# Patient Record
Sex: Male | Born: 1935 | Race: White | Hispanic: No | Smoking: Former smoker
Health system: Southern US, Community
[De-identification: ages and names within clinical notes are randomized; demographics above are authoritative.]

## PROBLEM LIST (undated history)

## (undated) DIAGNOSIS — R319 Hematuria, unspecified: Secondary | ICD-10-CM

## (undated) DIAGNOSIS — R972 Elevated prostate specific antigen [PSA]: Secondary | ICD-10-CM

## (undated) DIAGNOSIS — N400 Enlarged prostate without lower urinary tract symptoms: Secondary | ICD-10-CM

## (undated) DIAGNOSIS — N210 Calculus in bladder: Secondary | ICD-10-CM

## (undated) DIAGNOSIS — N3941 Urge incontinence: Secondary | ICD-10-CM

## (undated) DIAGNOSIS — R339 Retention of urine, unspecified: Secondary | ICD-10-CM

## (undated) DIAGNOSIS — R351 Nocturia: Secondary | ICD-10-CM

## (undated) DIAGNOSIS — N390 Urinary tract infection, site not specified: Secondary | ICD-10-CM

## (undated) DIAGNOSIS — G459 Transient cerebral ischemic attack, unspecified: Secondary | ICD-10-CM

## (undated) HISTORY — DX: Transient cerebral ischemic attack, unspecified: G45.9

## (undated) HISTORY — DX: Urge incontinence: N39.41

## (undated) HISTORY — PX: HERNIA REPAIR: SHX51

## (undated) HISTORY — PX: VASECTOMY: SHX75

## (undated) HISTORY — DX: Elevated prostate specific antigen (PSA): R97.20

## (undated) HISTORY — PX: TONSILLECTOMY: SUR1361

## (undated) HISTORY — DX: Calculus in bladder: N21.0

## (undated) HISTORY — DX: Urinary tract infection, site not specified: N39.0

## (undated) HISTORY — DX: Nocturia: R35.1

## (undated) HISTORY — DX: Hematuria, unspecified: R31.9

## (undated) HISTORY — DX: Benign prostatic hyperplasia without lower urinary tract symptoms: N40.0

## (undated) HISTORY — DX: Retention of urine, unspecified: R33.9

---

## 1999-02-23 ENCOUNTER — Inpatient Hospital Stay (HOSPITAL_COMMUNITY): Admission: EM | Admit: 1999-02-23 | Discharge: 1999-02-26 | Payer: Self-pay | Admitting: Emergency Medicine

## 2009-06-19 ENCOUNTER — Ambulatory Visit: Payer: Self-pay

## 2009-10-23 ENCOUNTER — Inpatient Hospital Stay: Payer: Self-pay | Admitting: Internal Medicine

## 2009-11-17 ENCOUNTER — Ambulatory Visit: Payer: Self-pay | Admitting: Gastroenterology

## 2010-02-24 ENCOUNTER — Ambulatory Visit: Payer: Self-pay | Admitting: Gastroenterology

## 2010-04-26 ENCOUNTER — Emergency Department: Payer: Self-pay | Admitting: Emergency Medicine

## 2010-07-14 ENCOUNTER — Emergency Department: Payer: Self-pay | Admitting: Emergency Medicine

## 2010-11-12 ENCOUNTER — Ambulatory Visit: Payer: Self-pay | Admitting: Neurology

## 2010-12-14 ENCOUNTER — Ambulatory Visit: Payer: Self-pay | Admitting: Gastroenterology

## 2011-09-06 ENCOUNTER — Ambulatory Visit: Payer: Self-pay | Admitting: Family Medicine

## 2011-09-12 ENCOUNTER — Ambulatory Visit: Payer: Self-pay | Admitting: Family Medicine

## 2011-10-13 ENCOUNTER — Ambulatory Visit: Payer: Self-pay | Admitting: Family Medicine

## 2012-01-12 ENCOUNTER — Ambulatory Visit: Payer: Self-pay | Admitting: Neurology

## 2012-01-19 ENCOUNTER — Ambulatory Visit: Payer: Self-pay | Admitting: Neurology

## 2012-02-04 ENCOUNTER — Emergency Department: Payer: Self-pay | Admitting: Internal Medicine

## 2012-07-12 ENCOUNTER — Emergency Department: Payer: Self-pay | Admitting: Emergency Medicine

## 2012-07-12 LAB — CBC
HCT: 38.3 % — ABNORMAL LOW (ref 40.0–52.0)
HGB: 13.4 g/dL (ref 13.0–18.0)
MCH: 32.8 pg (ref 26.0–34.0)
MCHC: 35 g/dL (ref 32.0–36.0)
MCV: 94 fL (ref 80–100)
Platelet: 179 10*3/uL (ref 150–440)
RBC: 4.08 10*6/uL — ABNORMAL LOW (ref 4.40–5.90)
RDW: 12.8 % (ref 11.5–14.5)
WBC: 6.2 10*3/uL (ref 3.8–10.6)

## 2012-07-12 LAB — COMPREHENSIVE METABOLIC PANEL
Albumin: 4 g/dL (ref 3.4–5.0)
Alkaline Phosphatase: 58 U/L (ref 50–136)
Anion Gap: 7 (ref 7–16)
BUN: 18 mg/dL (ref 7–18)
Bilirubin,Total: 1.7 mg/dL — ABNORMAL HIGH (ref 0.2–1.0)
Calcium, Total: 8.9 mg/dL (ref 8.5–10.1)
Chloride: 107 mmol/L (ref 98–107)
Co2: 29 mmol/L (ref 21–32)
Creatinine: 0.84 mg/dL (ref 0.60–1.30)
EGFR (African American): >60
EGFR (Non-African Amer.): >60
Glucose: 99 mg/dL (ref 65–99)
Osmolality: 287 (ref 275–301)
Potassium: 3.6 mmol/L (ref 3.5–5.1)
SGOT(AST): 32 U/L (ref 15–37)
SGPT (ALT): 27 U/L
Sodium: 143 mmol/L (ref 136–145)
Total Protein: 7.1 g/dL (ref 6.4–8.2)

## 2012-07-12 LAB — LIPID PANEL
Cholesterol: 133 mg/dL (ref 0–200)
HDL Cholesterol: 54 mg/dL (ref 40–60)
Ldl Cholesterol, Calc: 60 mg/dL (ref 0–100)
Triglycerides: 94 mg/dL (ref 0–200)
VLDL Cholesterol, Calc: 19 mg/dL (ref 5–40)

## 2012-07-12 LAB — URINALYSIS, COMPLETE
Bacteria: NONE SEEN
Bilirubin,UR: NEGATIVE
Blood: NEGATIVE
Glucose,UR: NEGATIVE mg/dL (ref 0–75)
Nitrite: NEGATIVE
Ph: 5 (ref 4.5–8.0)
Protein: NEGATIVE
RBC,UR: 4 /HPF (ref 0–5)
Specific Gravity: 1.019 (ref 1.003–1.030)
Squamous Epithelial: NONE SEEN
WBC UR: 2 /HPF (ref 0–5)

## 2012-07-12 LAB — PROTIME-INR
INR: 0.9
Prothrombin Time: 12.8 secs (ref 11.5–14.7)

## 2012-07-12 LAB — HEMOGLOBIN A1C: Hemoglobin A1C: 6 % (ref 4.2–6.3)

## 2012-08-07 ENCOUNTER — Encounter: Payer: Self-pay | Admitting: Neurology

## 2012-08-12 ENCOUNTER — Encounter: Payer: Self-pay | Admitting: Neurology

## 2012-09-11 ENCOUNTER — Encounter: Payer: Self-pay | Admitting: Neurology

## 2012-12-21 ENCOUNTER — Emergency Department: Payer: Self-pay | Admitting: Emergency Medicine

## 2013-06-29 IMAGING — US US CAROTID DUPLEX BILAT
1 series · 17 of 24 positions shown · non-contrast
Comparison: none

REASON FOR EXAM: TIA
COMMENTS:

[Series 1: us carotid duplex bilat · 17 of 54 slices shown]
[im 1/54]
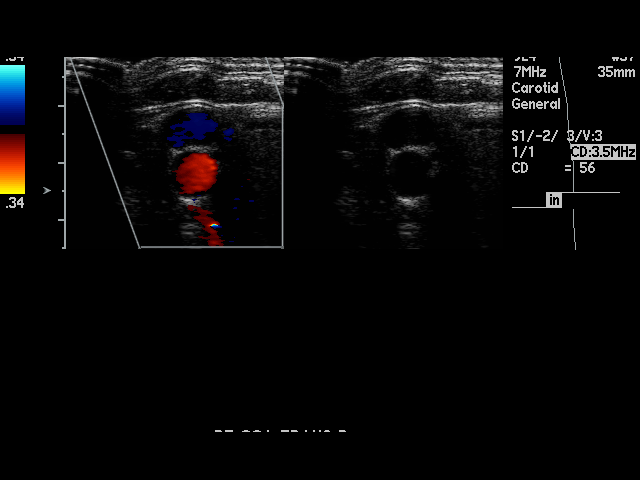
[im 5/54]
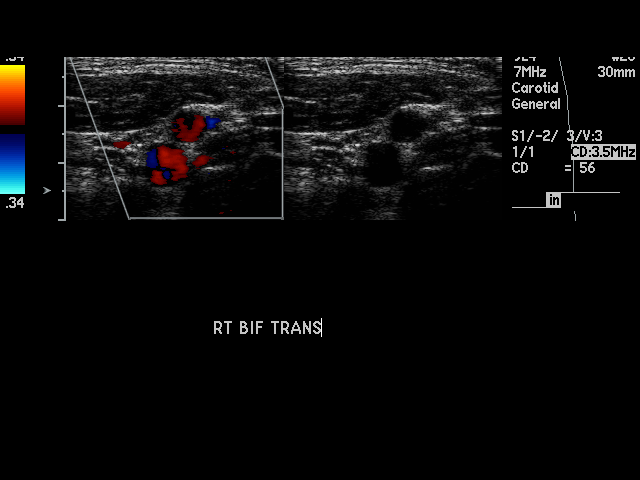
[im 7/54]
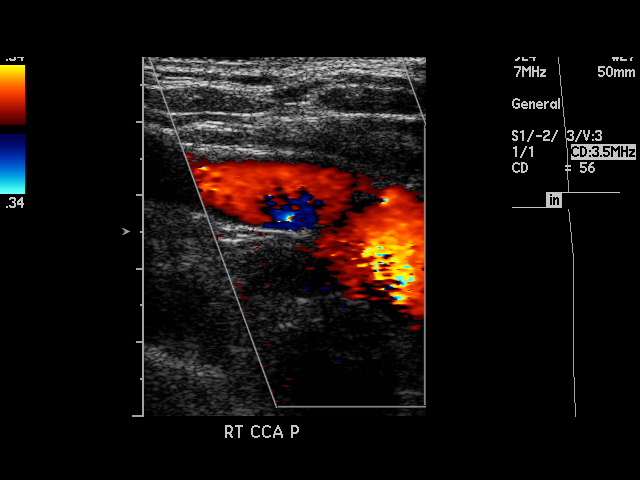
[im 10/54]
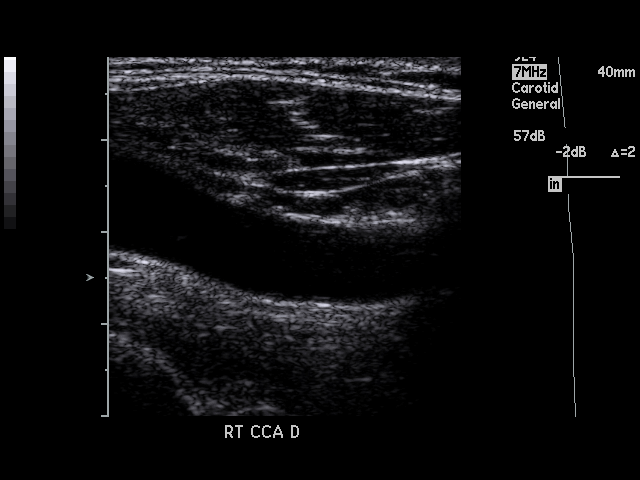
[im 14/54]
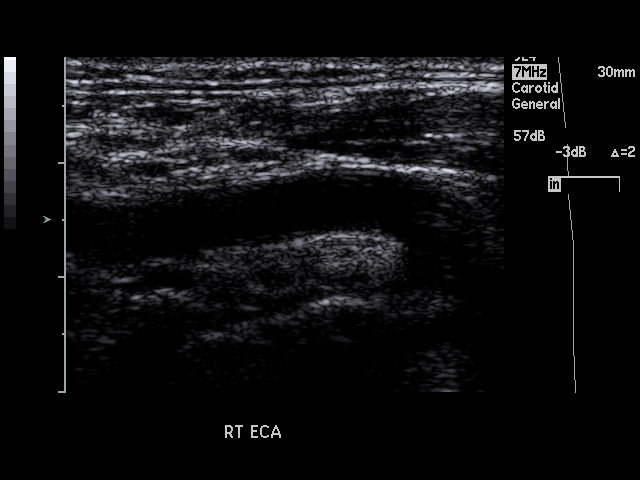
[im 17/54]
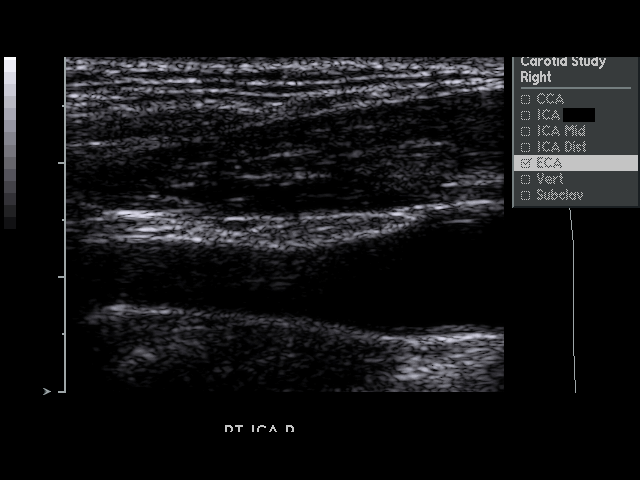
[im 21/54]
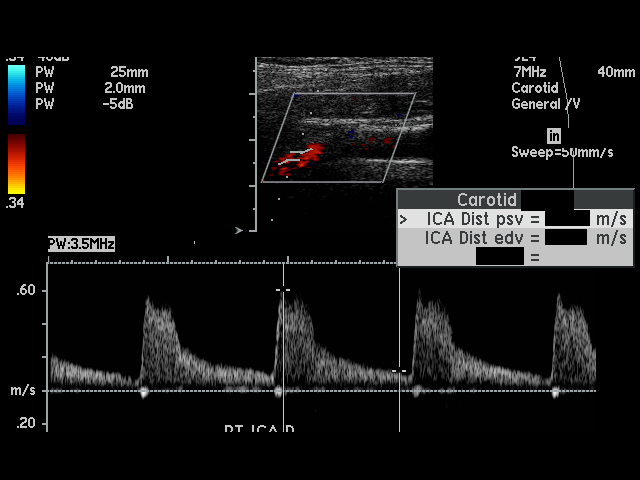
[im 24/54]
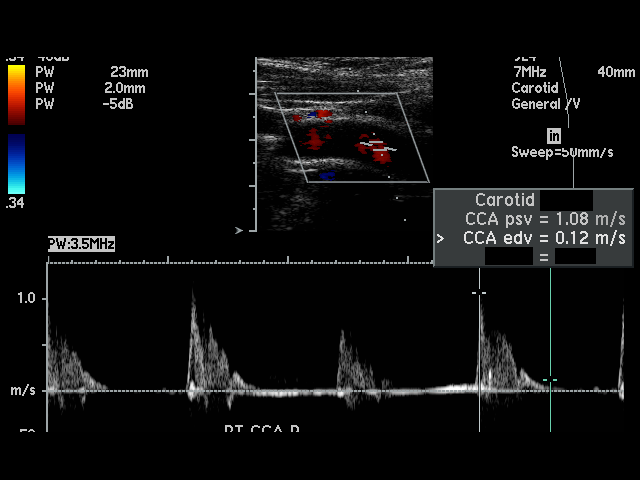
[im 28/54]
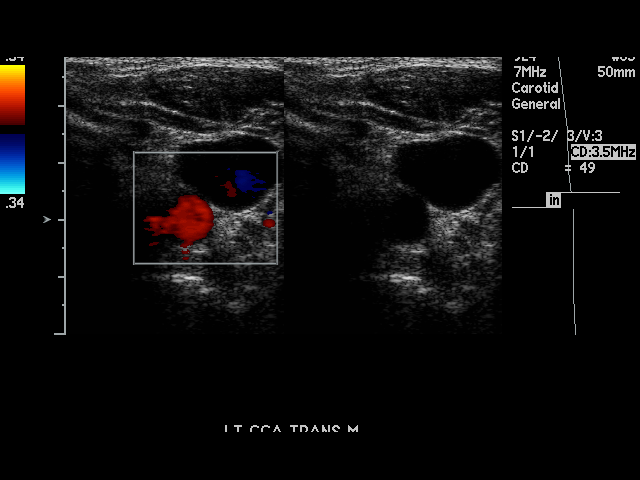
[im 30/54]
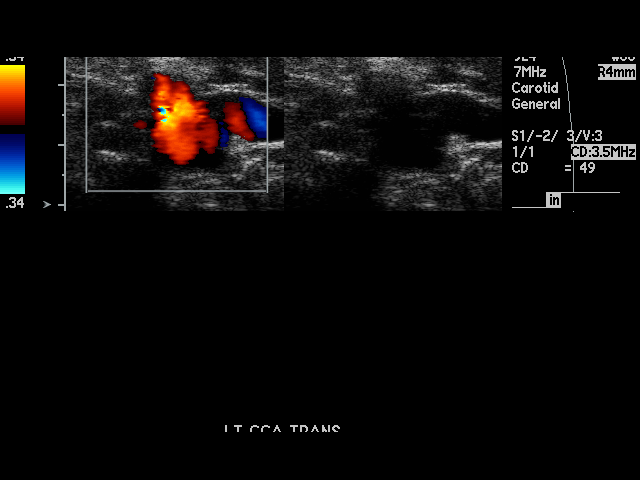
[im 33/54]
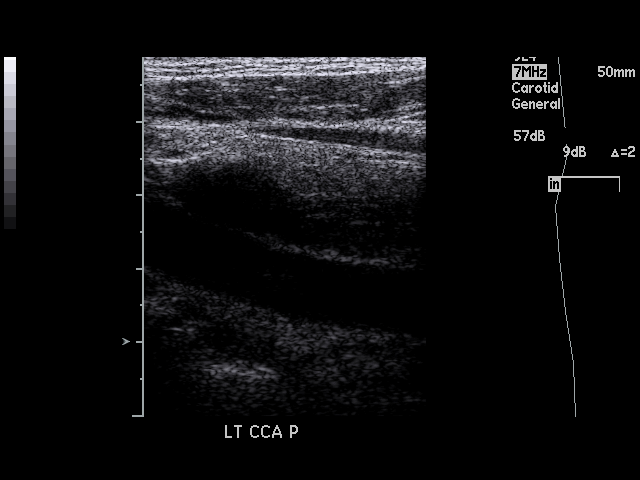
[im 37/54]
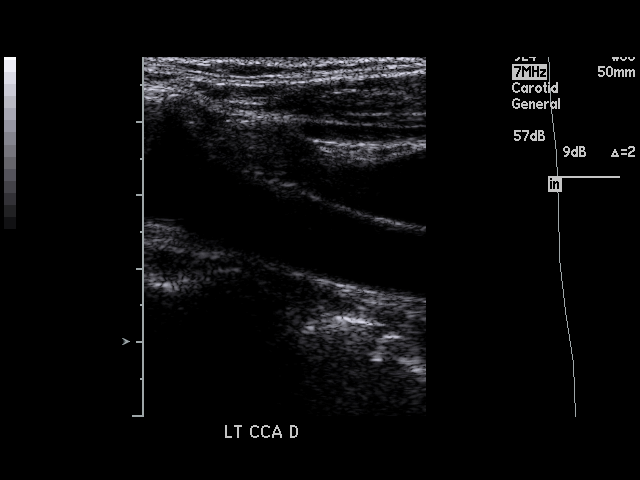
[im 40/54]
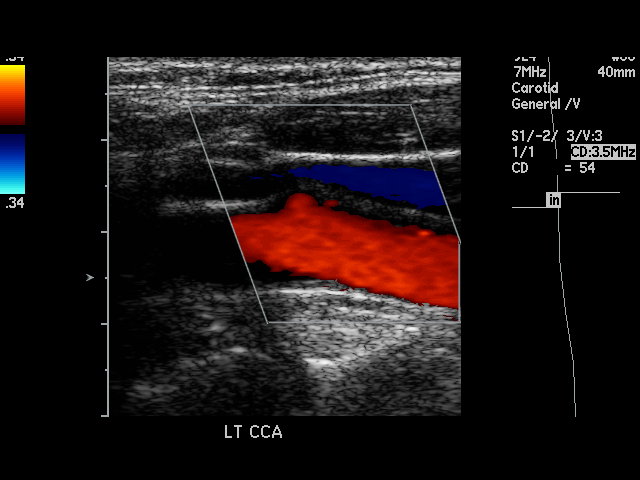
[im 44/54]
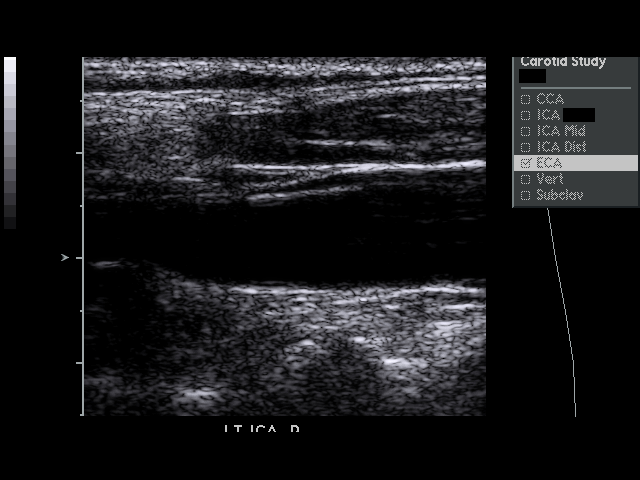
[im 47/54]
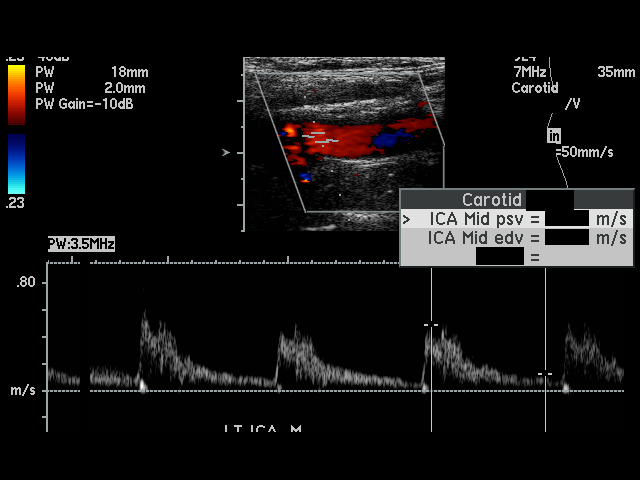
[im 49/54]
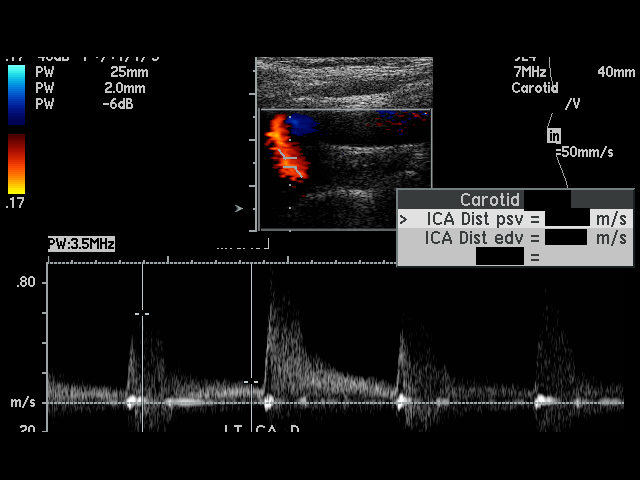
[im 54/54]
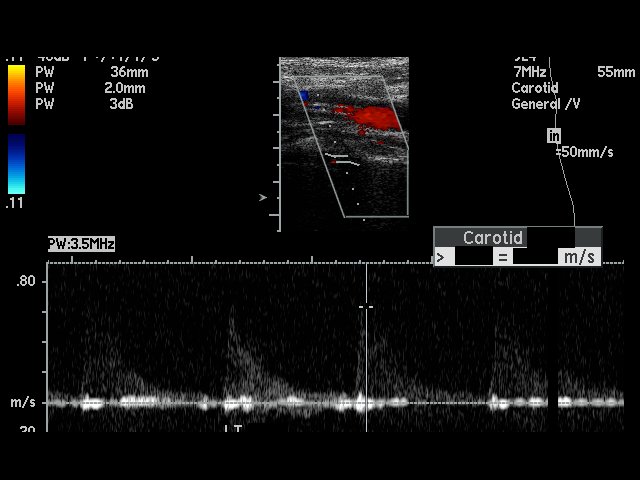

[17 of 24 positions shown; findings below may reference images not displayed]

PROCEDURE:     TELLIA - TELLIA CAROTID DOPPLER BILATERAL  - January 12, 2012  [DATE]

RESULT:     Carotid Doppler interrogation is performed in the standard
fashion. There is no previous study for comparison.

No atherosclerotic plaque formation is visualized in the carotids. Antegrade
flow is present in the carotids and vertebrals bilaterally. The peak
systolic velocities are normal bilaterally. The internal to common carotid
peak systolic velocity ratio is 0.86 on the right and 0.87 on the left.
IMPRESSION: 1. No evidence of hemodynamically significant stenosis. No atherosclerotic
disease demonstrated.

## 2013-09-11 ENCOUNTER — Emergency Department: Payer: Self-pay | Admitting: Emergency Medicine

## 2013-09-11 LAB — COMPREHENSIVE METABOLIC PANEL
Albumin: 3.7 g/dL (ref 3.4–5.0)
Alkaline Phosphatase: 60 U/L (ref 50–136)
Anion Gap: 7 (ref 7–16)
BUN: 17 mg/dL (ref 7–18)
Bilirubin,Total: 1.1 mg/dL — ABNORMAL HIGH (ref 0.2–1.0)
Calcium, Total: 9.2 mg/dL (ref 8.5–10.1)
Chloride: 110 mmol/L — ABNORMAL HIGH (ref 98–107)
Co2: 25 mmol/L (ref 21–32)
Creatinine: 1.11 mg/dL (ref 0.60–1.30)
EGFR (African American): >60
EGFR (Non-African Amer.): >60
Glucose: 136 mg/dL — ABNORMAL HIGH (ref 65–99)
Osmolality: 287 (ref 275–301)
Potassium: 3.8 mmol/L (ref 3.5–5.1)
SGOT(AST): 24 U/L (ref 15–37)
SGPT (ALT): 23 U/L (ref 12–78)
Sodium: 142 mmol/L (ref 136–145)
Total Protein: 6.9 g/dL (ref 6.4–8.2)

## 2013-09-11 LAB — URINALYSIS, COMPLETE
Bacteria: NONE SEEN
Bilirubin,UR: NEGATIVE
Blood: NEGATIVE
Glucose,UR: NEGATIVE mg/dL (ref 0–75)
Ketone: NEGATIVE
Leukocyte Esterase: NEGATIVE
Nitrite: NEGATIVE
Ph: 5 (ref 4.5–8.0)
Protein: NEGATIVE
RBC,UR: 1 /HPF (ref 0–5)
Specific Gravity: 1.021 (ref 1.003–1.030)
Squamous Epithelial: NONE SEEN
WBC UR: 2 /HPF (ref 0–5)

## 2013-09-11 LAB — CBC
HCT: 40 % (ref 40.0–52.0)
HGB: 13.7 g/dL (ref 13.0–18.0)
MCH: 31.5 pg (ref 26.0–34.0)
MCHC: 34.3 g/dL (ref 32.0–36.0)
MCV: 92 fL (ref 80–100)
Platelet: 205 10*3/uL (ref 150–440)
RBC: 4.35 10*6/uL — ABNORMAL LOW (ref 4.40–5.90)
RDW: 12.6 % (ref 11.5–14.5)
WBC: 6.2 10*3/uL (ref 3.8–10.6)

## 2013-09-11 LAB — TROPONIN I: Troponin-I: <0.02 ng/mL

## 2013-09-27 ENCOUNTER — Encounter: Payer: Self-pay | Admitting: Neurology

## 2013-10-12 ENCOUNTER — Encounter: Payer: Self-pay | Admitting: Neurology

## 2013-11-11 ENCOUNTER — Encounter: Payer: Self-pay | Admitting: Neurology

## 2014-02-13 ENCOUNTER — Ambulatory Visit: Payer: Self-pay | Admitting: Urology

## 2014-03-26 ENCOUNTER — Ambulatory Visit: Payer: Self-pay | Admitting: Urology

## 2014-03-26 LAB — BASIC METABOLIC PANEL
Anion Gap: 7 (ref 7–16)
BUN: 20 mg/dL — ABNORMAL HIGH (ref 7–18)
CREATININE: 0.76 mg/dL (ref 0.60–1.30)
Calcium, Total: 8.9 mg/dL (ref 8.5–10.1)
Chloride: 109 mmol/L — ABNORMAL HIGH (ref 98–107)
Co2: 27 mmol/L (ref 21–32)
EGFR (African American): >60
Glucose: 137 mg/dL — ABNORMAL HIGH (ref 65–99)
Osmolality: 290 (ref 275–301)
Potassium: 3.4 mmol/L — ABNORMAL LOW (ref 3.5–5.1)
SODIUM: 143 mmol/L (ref 136–145)

## 2014-03-26 LAB — CBC
HCT: 40.3 % (ref 40.0–52.0)
HGB: 13 g/dL (ref 13.0–18.0)
MCH: 30.5 pg (ref 26.0–34.0)
MCHC: 32.3 g/dL (ref 32.0–36.0)
MCV: 95 fL (ref 80–100)
Platelet: 167 10*3/uL (ref 150–440)
RBC: 4.26 10*6/uL — ABNORMAL LOW (ref 4.40–5.90)
RDW: 13.1 % (ref 11.5–14.5)
WBC: 6.1 10*3/uL (ref 3.8–10.6)

## 2014-04-07 ENCOUNTER — Ambulatory Visit: Payer: Self-pay | Admitting: Urology

## 2014-05-27 ENCOUNTER — Ambulatory Visit: Payer: Self-pay | Admitting: Gastroenterology

## 2014-09-03 ENCOUNTER — Emergency Department: Payer: Self-pay | Admitting: Emergency Medicine

## 2014-10-01 ENCOUNTER — Inpatient Hospital Stay: Payer: Self-pay | Admitting: Internal Medicine

## 2014-10-01 LAB — COMPREHENSIVE METABOLIC PANEL
ALBUMIN: 3.7 g/dL (ref 3.4–5.0)
ANION GAP: 6 — AB (ref 7–16)
AST: 32 U/L (ref 15–37)
Alkaline Phosphatase: 62 U/L
BUN: 20 mg/dL — AB (ref 7–18)
Bilirubin,Total: 0.8 mg/dL (ref 0.2–1.0)
CALCIUM: 8.5 mg/dL (ref 8.5–10.1)
CHLORIDE: 110 mmol/L — AB (ref 98–107)
Co2: 26 mmol/L (ref 21–32)
Creatinine: 1.01 mg/dL (ref 0.60–1.30)
EGFR (Non-African Amer.): >60
Glucose: 116 mg/dL — ABNORMAL HIGH (ref 65–99)
OSMOLALITY: 287 (ref 275–301)
Potassium: 4.2 mmol/L (ref 3.5–5.1)
SGPT (ALT): 26 U/L
Sodium: 142 mmol/L (ref 136–145)
TOTAL PROTEIN: 6.8 g/dL (ref 6.4–8.2)

## 2014-10-01 LAB — CBC
HCT: 37.4 % — ABNORMAL LOW (ref 40.0–52.0)
HGB: 12.2 g/dL — ABNORMAL LOW (ref 13.0–18.0)
MCH: 31 pg (ref 26.0–34.0)
MCHC: 32.5 g/dL (ref 32.0–36.0)
MCV: 95 fL (ref 80–100)
Platelet: 180 10*3/uL (ref 150–440)
RBC: 3.93 10*6/uL — AB (ref 4.40–5.90)
RDW: 12.8 % (ref 11.5–14.5)
WBC: 8.8 10*3/uL (ref 3.8–10.6)

## 2014-10-01 LAB — APTT: Activated PTT: 26.9 secs (ref 23.6–35.9)

## 2014-10-01 LAB — URINALYSIS, COMPLETE
BLOOD: NEGATIVE
Bacteria: NONE SEEN
Bilirubin,UR: NEGATIVE
GLUCOSE, UR: NEGATIVE mg/dL (ref 0–75)
Ketone: NEGATIVE
Nitrite: NEGATIVE
PROTEIN: NEGATIVE
Ph: 5 (ref 4.5–8.0)
RBC,UR: 2 /HPF (ref 0–5)
Specific Gravity: 1.021 (ref 1.003–1.030)
Squamous Epithelial: NONE SEEN
WBC UR: 4 /HPF (ref 0–5)

## 2014-10-01 LAB — PROTIME-INR
INR: 1
Prothrombin Time: 13.5 secs (ref 11.5–14.7)

## 2014-10-01 LAB — CK TOTAL AND CKMB (NOT AT ARMC)
CK, Total: 111 U/L
CK-MB: 3.4 ng/mL (ref 0.5–3.6)

## 2014-10-01 LAB — TROPONIN I: Troponin-I: <0.02 ng/mL

## 2014-10-02 LAB — CBC WITH DIFFERENTIAL/PLATELET
Basophil #: 0.1 10*3/uL (ref 0.0–0.1)
Basophil %: 0.7 %
Eosinophil #: 0.2 10*3/uL (ref 0.0–0.7)
Eosinophil %: 1.4 %
HCT: 34.5 % — AB (ref 40.0–52.0)
HGB: 11.1 g/dL — AB (ref 13.0–18.0)
LYMPHS ABS: 0.6 10*3/uL — AB (ref 1.0–3.6)
Lymphocyte %: 5.7 %
MCH: 30.5 pg (ref 26.0–34.0)
MCHC: 32 g/dL (ref 32.0–36.0)
MCV: 95 fL (ref 80–100)
Monocyte #: 1.1 x10 3/mm — ABNORMAL HIGH (ref 0.2–1.0)
Monocyte %: 9.4 %
NEUTROS ABS: 9.4 10*3/uL — AB (ref 1.4–6.5)
NEUTROS PCT: 82.8 %
Platelet: 163 10*3/uL (ref 150–440)
RBC: 3.63 10*6/uL — ABNORMAL LOW (ref 4.40–5.90)
RDW: 12.8 % (ref 11.5–14.5)
WBC: 11.4 10*3/uL — AB (ref 3.8–10.6)

## 2014-10-02 LAB — BASIC METABOLIC PANEL
Anion Gap: 6 — ABNORMAL LOW (ref 7–16)
BUN: 20 mg/dL — AB (ref 7–18)
CALCIUM: 8.3 mg/dL — AB (ref 8.5–10.1)
CREATININE: 0.99 mg/dL (ref 0.60–1.30)
Chloride: 107 mmol/L (ref 98–107)
Co2: 27 mmol/L (ref 21–32)
EGFR (African American): >60
EGFR (Non-African Amer.): >60
GLUCOSE: 115 mg/dL — AB (ref 65–99)
OSMOLALITY: 283 (ref 275–301)
POTASSIUM: 4.1 mmol/L (ref 3.5–5.1)
SODIUM: 140 mmol/L (ref 136–145)

## 2014-10-03 LAB — URINE CULTURE

## 2014-10-04 LAB — BASIC METABOLIC PANEL
Anion Gap: 7 (ref 7–16)
BUN: 16 mg/dL (ref 7–18)
CHLORIDE: 106 mmol/L (ref 98–107)
CO2: 26 mmol/L (ref 21–32)
Calcium, Total: 8.1 mg/dL — ABNORMAL LOW (ref 8.5–10.1)
Creatinine: 0.81 mg/dL (ref 0.60–1.30)
EGFR (African American): >60
EGFR (Non-African Amer.): >60
Glucose: 147 mg/dL — ABNORMAL HIGH (ref 65–99)
Osmolality: 281 (ref 275–301)
Potassium: 4.3 mmol/L (ref 3.5–5.1)
Sodium: 139 mmol/L (ref 136–145)

## 2014-10-04 LAB — HEMOGLOBIN: HGB: 10.3 g/dL — ABNORMAL LOW (ref 13.0–18.0)

## 2014-10-04 LAB — PLATELET COUNT: Platelet: 166 10*3/uL (ref 150–440)

## 2014-10-06 LAB — CLOSTRIDIUM DIFFICILE(ARMC)

## 2014-10-08 DIAGNOSIS — S72001A Fracture of unspecified part of neck of right femur, initial encounter for closed fracture: Secondary | ICD-10-CM

## 2014-10-08 DIAGNOSIS — A047 Enterocolitis due to Clostridium difficile: Secondary | ICD-10-CM

## 2014-10-08 DIAGNOSIS — F0390 Unspecified dementia without behavioral disturbance: Secondary | ICD-10-CM

## 2014-10-10 DIAGNOSIS — F419 Anxiety disorder, unspecified: Secondary | ICD-10-CM

## 2014-10-13 DIAGNOSIS — A047 Enterocolitis due to Clostridium difficile: Secondary | ICD-10-CM

## 2014-10-13 DIAGNOSIS — S7292XA Unspecified fracture of left femur, initial encounter for closed fracture: Secondary | ICD-10-CM

## 2014-10-13 DIAGNOSIS — F0150 Vascular dementia without behavioral disturbance: Secondary | ICD-10-CM

## 2014-10-13 DIAGNOSIS — F39 Unspecified mood [affective] disorder: Secondary | ICD-10-CM

## 2014-10-13 DIAGNOSIS — N400 Enlarged prostate without lower urinary tract symptoms: Secondary | ICD-10-CM

## 2014-10-17 DIAGNOSIS — N508 Other specified disorders of male genital organs: Secondary | ICD-10-CM

## 2014-10-17 DIAGNOSIS — N492 Inflammatory disorders of scrotum: Secondary | ICD-10-CM

## 2014-10-17 DIAGNOSIS — B372 Candidiasis of skin and nail: Secondary | ICD-10-CM

## 2014-11-13 DIAGNOSIS — S72002A Fracture of unspecified part of neck of left femur, initial encounter for closed fracture: Secondary | ICD-10-CM

## 2014-11-13 DIAGNOSIS — F0150 Vascular dementia without behavioral disturbance: Secondary | ICD-10-CM

## 2014-11-24 DIAGNOSIS — Z634 Disappearance and death of family member: Secondary | ICD-10-CM

## 2014-12-25 DIAGNOSIS — F39 Unspecified mood [affective] disorder: Secondary | ICD-10-CM

## 2014-12-25 DIAGNOSIS — F0150 Vascular dementia without behavioral disturbance: Secondary | ICD-10-CM

## 2014-12-25 DIAGNOSIS — N401 Enlarged prostate with lower urinary tract symptoms: Secondary | ICD-10-CM

## 2015-01-06 ENCOUNTER — Inpatient Hospital Stay: Payer: Self-pay | Admitting: Internal Medicine

## 2015-01-06 LAB — BASIC METABOLIC PANEL
ANION GAP: 7 (ref 7–16)
BUN: 30 mg/dL — AB (ref 7–18)
CREATININE: 1.85 mg/dL — AB (ref 0.60–1.30)
Calcium, Total: 9 mg/dL (ref 8.5–10.1)
Chloride: 106 mmol/L (ref 98–107)
Co2: 26 mmol/L (ref 21–32)
EGFR (Non-African Amer.): 37 — ABNORMAL LOW
GFR CALC AF AMER: 45 — AB
GLUCOSE: 102 mg/dL — AB (ref 65–99)
Osmolality: 284 (ref 275–301)
Potassium: 4.3 mmol/L (ref 3.5–5.1)
Sodium: 139 mmol/L (ref 136–145)

## 2015-01-06 LAB — CBC WITH DIFFERENTIAL/PLATELET
Basophil #: 0 10*3/uL (ref 0.0–0.1)
Basophil %: 0.3 %
Eosinophil #: 0.1 10*3/uL (ref 0.0–0.7)
Eosinophil %: 0.6 %
HCT: 37.7 % — AB (ref 40.0–52.0)
HGB: 12 g/dL — ABNORMAL LOW (ref 13.0–18.0)
Lymphocyte #: 0.6 10*3/uL — ABNORMAL LOW (ref 1.0–3.6)
Lymphocyte %: 4.8 %
MCH: 27.5 pg (ref 26.0–34.0)
MCHC: 31.8 g/dL — AB (ref 32.0–36.0)
MCV: 87 fL (ref 80–100)
MONO ABS: 1.1 x10 3/mm — AB (ref 0.2–1.0)
Monocyte %: 9.1 %
NEUTROS ABS: 10.3 10*3/uL — AB (ref 1.4–6.5)
Neutrophil %: 85.2 %
RBC: 4.35 10*6/uL — ABNORMAL LOW (ref 4.40–5.90)
RDW: 16.4 % — ABNORMAL HIGH (ref 11.5–14.5)
WBC: 12.1 10*3/uL — ABNORMAL HIGH (ref 3.8–10.6)

## 2015-01-06 LAB — LIPASE, BLOOD: Lipase: 286 U/L (ref 73–393)

## 2015-01-06 LAB — URINALYSIS, COMPLETE
Bacteria: NONE SEEN
Bilirubin,UR: NEGATIVE
GLUCOSE, UR: NEGATIVE mg/dL (ref 0–75)
KETONE: NEGATIVE
Nitrite: NEGATIVE
PH: 5 (ref 4.5–8.0)
Protein: 30
RBC,UR: 222 /HPF (ref 0–5)
Specific Gravity: 1.016 (ref 1.003–1.030)
WBC UR: 64 /HPF (ref 0–5)

## 2015-01-06 LAB — CK: CK, TOTAL: 162 U/L (ref 39–308)

## 2015-01-06 LAB — TROPONIN I: Troponin-I: <0.02 ng/mL

## 2015-01-07 LAB — BASIC METABOLIC PANEL
ANION GAP: 8 (ref 7–16)
ANION GAP: 8 (ref 7–16)
BUN: 32 mg/dL — AB (ref 7–18)
BUN: 25 mg/dL — ABNORMAL HIGH (ref 7–18)
CO2: 26 mmol/L (ref 21–32)
CREATININE: 2.17 mg/dL — AB (ref 0.60–1.30)
Calcium, Total: 8.7 mg/dL (ref 8.5–10.1)
Calcium, Total: 8.3 mg/dL — ABNORMAL LOW (ref 8.5–10.1)
Chloride: 108 mmol/L — ABNORMAL HIGH (ref 98–107)
Chloride: 108 mmol/L — ABNORMAL HIGH (ref 98–107)
Co2: 25 mmol/L (ref 21–32)
Creatinine: 1.47 mg/dL — ABNORMAL HIGH (ref 0.60–1.30)
EGFR (African American): 38 — ABNORMAL LOW
EGFR (African American): 59 — ABNORMAL LOW
EGFR (Non-African Amer.): 31 — ABNORMAL LOW
EGFR (Non-African Amer.): 49 — ABNORMAL LOW
GLUCOSE: 91 mg/dL (ref 65–99)
Glucose: 117 mg/dL — ABNORMAL HIGH (ref 65–99)
Osmolality: 288 (ref 275–301)
Osmolality: 289 (ref 275–301)
Potassium: 3.6 mmol/L (ref 3.5–5.1)
Potassium: 3.6 mmol/L (ref 3.5–5.1)
SODIUM: 142 mmol/L (ref 136–145)
Sodium: 141 mmol/L (ref 136–145)

## 2015-01-07 LAB — CBC WITH DIFFERENTIAL/PLATELET
Basophil #: 0 10*3/uL (ref 0.0–0.1)
Basophil %: 0.4 %
Eosinophil #: 0.1 10*3/uL (ref 0.0–0.7)
Eosinophil %: 0.8 %
HCT: 32.4 % — ABNORMAL LOW (ref 40.0–52.0)
HGB: 10.4 g/dL — ABNORMAL LOW (ref 13.0–18.0)
Lymphocyte #: 0.5 10*3/uL — ABNORMAL LOW (ref 1.0–3.6)
Lymphocyte %: 4.5 %
MCH: 27.7 pg (ref 26.0–34.0)
MCHC: 32 g/dL (ref 32.0–36.0)
MCV: 87 fL (ref 80–100)
Monocyte #: 1.1 x10 3/mm — ABNORMAL HIGH (ref 0.2–1.0)
Monocyte %: 10.4 %
NEUTROS ABS: 9.1 10*3/uL — AB (ref 1.4–6.5)
Neutrophil %: 83.9 %
Platelet: 166 10*3/uL (ref 150–440)
RBC: 3.75 10*6/uL — ABNORMAL LOW (ref 4.40–5.90)
RDW: 16.4 % — AB (ref 11.5–14.5)
WBC: 10.8 10*3/uL — AB (ref 3.8–10.6)

## 2015-01-07 LAB — HEMOGLOBIN A1C: Hemoglobin A1C: 5.9 % (ref 4.2–6.3)

## 2015-01-08 LAB — BASIC METABOLIC PANEL
Anion Gap: 6 — ABNORMAL LOW (ref 7–16)
BUN: 16 mg/dL (ref 7–18)
Calcium, Total: 8.1 mg/dL — ABNORMAL LOW (ref 8.5–10.1)
Chloride: 112 mmol/L — ABNORMAL HIGH (ref 98–107)
Co2: 26 mmol/L (ref 21–32)
Creatinine: 0.85 mg/dL (ref 0.60–1.30)
EGFR (African American): >60
GLUCOSE: 110 mg/dL — AB (ref 65–99)
Osmolality: 289 (ref 275–301)
POTASSIUM: 3.4 mmol/L — AB (ref 3.5–5.1)
Sodium: 144 mmol/L (ref 136–145)

## 2015-01-08 LAB — CBC WITH DIFFERENTIAL/PLATELET
BASOS ABS: 0 10*3/uL (ref 0.0–0.1)
Basophil %: 0.7 %
Eosinophil #: 0.3 10*3/uL (ref 0.0–0.7)
Eosinophil %: 3.5 %
HCT: 30.3 % — ABNORMAL LOW (ref 40.0–52.0)
HGB: 9.7 g/dL — AB (ref 13.0–18.0)
LYMPHS ABS: 0.4 10*3/uL — AB (ref 1.0–3.6)
Lymphocyte %: 6 %
MCH: 27.4 pg (ref 26.0–34.0)
MCHC: 31.9 g/dL — AB (ref 32.0–36.0)
MCV: 86 fL (ref 80–100)
Monocyte #: 0.5 x10 3/mm (ref 0.2–1.0)
Monocyte %: 6.9 %
NEUTROS PCT: 82.9 %
Neutrophil #: 5.9 10*3/uL (ref 1.4–6.5)
PLATELETS: 155 10*3/uL (ref 150–440)
RBC: 3.52 10*6/uL — AB (ref 4.40–5.90)
RDW: 16.2 % — ABNORMAL HIGH (ref 11.5–14.5)
WBC: 7.1 10*3/uL (ref 3.8–10.6)

## 2015-01-08 LAB — URINE CULTURE

## 2015-01-12 ENCOUNTER — Telehealth: Payer: Self-pay | Admitting: *Deleted

## 2015-01-12 DIAGNOSIS — R451 Restlessness and agitation: Secondary | ICD-10-CM

## 2015-01-12 DIAGNOSIS — R339 Retention of urine, unspecified: Secondary | ICD-10-CM

## 2015-01-12 NOTE — Telephone Encounter (Signed)
PLEASE NOTE: All timestamps contained within this report are represented as [place] Standard Time. CONFIDENTIALTY NOTICE: This fax transmission is intended only for the addressee. It contains information that is legally privileged, confidential or otherwise protected from use or disclosure. If you are not the intended recipient, you are strictly prohibited from reviewing, disclosing, copying using or disseminating any of this information or taking any action in reliance on or regarding this information. If you have received this fax in error, please notify us immediately by telephone so that we can arrange for its return to [place]. Phone: [phone], Toll-Free: [phone], Fax: [phone] Page: 1 of 2 Call Id: [id number] [facility] Night - Client TELEPHONE ADVICE RECORD [facility] Medical Call Center Patient Name: John Petersen Gender: Male DOB: 04-11-1929 Age: 81 Y 2 M 16 D Return Phone Number: [phone] (Primary) Address: [address] City/State/Zip: [place] [address] Client [facility] Night - Client Client Site [facility] - Night Contact Type Call Call Type Triage / Clinical Relationship To Patient Other Return Phone Number [phone] (Primary) Chief Complaint CONFUSION - new onset Initial Comment Caller states calling about patient. Is very confused and combative. Trying to hit staff PreDisposition Did not know what to do Nurse Assessment Nurse: Andee Poles, RN, Philis Pique Date/Time (Eastern Time): 01/11/2015 8:40:26 PM Confirm and document reason for call. If symptomatic, describe symptoms. ---Caller states that her pt is combative and confused and requesting Ativan. Has the patient traveled out of the country within the last 30 days? ---Not Applicable Does the patient require triage? ---Yes Related visit to physician within the last 2 weeks? ---N/A Does the PT have any chronic conditions? (i.e.  diabetes, asthma, etc.) ---Yes List chronic conditions. ---Dementia, prostate enlargement, indwelling cath, UTI treatment started 01//28/16 Guidelines Guideline Title Affirmed Question Affirmed Notes Nurse Date/Time Lamount Cohen Time) Confusion - Delirium [1] Longstanding confusion (e.g., dementia, stroke) AND [2] worsening Humphrey, RN, Laticia 01/11/2015 8:23:23 PM Disp. Time Lamount Cohen Time) Disposition Final User 01/11/2015 7:55:41 PM Send to Urgent Queue Shirley Muscat 01/11/2015 7:55:58 PM Send to Urgent Queue Shirley Muscat 01/11/2015 7:56:22 PM Send to Urgent Queue Shirley Muscat 01/11/2015 8:07:57 PM Send to Urgent Jonah Blue, RN, Marchelle Folks 01/11/2015 8:38:50 PM Paged On Call back to [facility], RN, Laticia 01/11/2015 8:39:51 PM See Physician within 4 Hours (or PCP triage) Yes Humphrey, RN, Philis Pique PLEASE NOTE: All timestamps contained within this report are represented as [place] Standard Time. CONFIDENTIALTY NOTICE: This fax transmission is intended only for the addressee. It contains information that is legally privileged, confidential or otherwise protected from use or disclosure. If you are not the intended recipient, you are strictly prohibited from reviewing, disclosing, copying using or disseminating any of this information or taking any action in reliance on or regarding this information. If you have received this fax in error, please notify us immediately by telephone so that we can arrange for its return to [place]. Phone: [phone], Toll-Free: [phone], Fax: [phone] Page: 2 of 2 Call Id: [id number] Caller Understands: Yes Disagree/Comply: Disagree Disagree/Comply Reason: Disagree with instructions Care Advice Given Per Guideline SEE PHYSICIAN WITHIN 4 HOURS (or PCP triage): * IF NO PCP TRIAGE: You need to be seen. Go to _______________ (ED/ UCC or office if it will be open) within the next 3 or 4 hours. Go sooner if you become worse. CARE ADVICE given  per Confusion- Delirium (Adult) guideline. After Care Instructions Given Call Event Type User Date / Time Description Comments User:  Shirley Muscateresa, Gillespie Date/Time Lamount Cohen(Eastern Time): 01/11/2015 7:54:51 PM [facility] calling for order to give patient medicine room [address]adeStephanie Kennedy call back number is [phone] Paging DoctorName DoctorPhone DateTime Result/Outcome Notes Eustaquio BoydenGutierrez, Javier [phone] 01/11/2015 8:38:50 PM Paged On Call Back to Call Center Please call Laticia at the call center [phone] Eustaquio BoydenGutierrez, Javier 01/11/2015 8:47:14 PM Spoke with On Call - General Geve report to Dr. Sharen HonesGutierrez, conferenced trx to [facility] per physician request.

## 2015-01-12 NOTE — Telephone Encounter (Signed)
Seen today and new orders done--quetiapine started Discussed with son

## 2015-02-18 ENCOUNTER — Ambulatory Visit: Payer: Medicare Other | Admitting: Internal Medicine

## 2015-03-25 DIAGNOSIS — N401 Enlarged prostate with lower urinary tract symptoms: Secondary | ICD-10-CM | POA: Diagnosis not present

## 2015-03-25 DIAGNOSIS — R339 Retention of urine, unspecified: Secondary | ICD-10-CM

## 2015-03-25 DIAGNOSIS — F39 Unspecified mood [affective] disorder: Secondary | ICD-10-CM

## 2015-03-25 DIAGNOSIS — E785 Hyperlipidemia, unspecified: Secondary | ICD-10-CM | POA: Diagnosis not present

## 2015-03-25 DIAGNOSIS — F0150 Vascular dementia without behavioral disturbance: Secondary | ICD-10-CM | POA: Diagnosis not present

## 2015-03-25 DIAGNOSIS — R451 Restlessness and agitation: Secondary | ICD-10-CM | POA: Diagnosis not present

## 2015-04-04 NOTE — Discharge Summary (Signed)
PATIENT NAME:  John Petersen, Erhard M MR#:  [id number] DATE OF BIRTH:  10-Oct-1929  DATE OF ADMISSION:  10/01/2014 DATE OF DISCHARGE:  10/06/2014  DISPOSITION:  The patient is going to be discharged to [facility].     PRESENTING COMPLAINT: Fall with hip pain.   PRIMARY CARE PHYSICIAN:  Barry BrunnerGlenn Willett., MD    CONSULTING PHYSICIAN: Francesco SorJames Hooten, MD   PROCEDURES: Left femoral neck fracture status post left-sided hemiarthroplasty done on 10/03/2014.    DISCHARGE LABORATORY DATA:  C. diff positive, hemoglobin 10.3, platelet count is 166,000.  Creatinine is 0.81. White count is 11.4. Urinalysis negative for urinary tract infection. Urine culture negative.   CODE STATUS: No code, DO NOT RESUSCITATE.    Diet regular. Physical therapy. Follow up with Dr. Ernest PineHooten per his instruction.  MEDICATIONS:  1.  Tylenol 500 to 1000 mg every 4 p.r.n. for pain and/or fever.   2.  Oxycodone 5 mg every 4 p.r.n.  3.  Senokot 1 tablet b.i.d. p.r.n.  4.  Milk of magnesia 30 mL b.i.d. p.r.n.  5.  Alprazolam 0.25 b.i.d. p.r.n.  6.  Protonix 40 mg b.i.d.  7.  Aricept 10 mg at bedtime.  8.  Mevacor 20 mg p.o. daily.  9.  Ultram 50 mg 50 mg every 4 p.r.n.  10. Flomax 0.4 mg daily after meals.  11. Lovenox 30 mg subcutaneous b.i.d. for 14 days.  12. Aspirin 1 capsule b.i.d.  13. Flagyl 500 mg p.o. t.i.d. for 10 days.   BRIEF SUMMARY OF HOSPITAL COURSE:  The patient is an 81 year old Caucasian gentleman with a history of stroke and dementia. Came in after he sustained a fall, brought to the Emergency Room and was found to have:  1.  Left femoral neck fracture. The patient was seen by Dr. Ernest PineHooten, underwent surgery 10/23, postoperative improving slowly with PT.  The patient will go to rehab [facility].  2.  Hyperlipidemia. Continue statins.  3.  Dementia on Aricept.  We did give her p.r.n. Xanax for anxiety.  4.  Benign prostatic hypertrophy. Continue Flomax.  5.  History of cerebrovascular accident.   Aggrenox was continued after surgery.  6.  Clostridium difficile diarrhea, now on Flagyl.  No fever, tolerating p.o. diet well. Electrolytes, appears stable.  The patient will finish up a 10 day course of Flagyl.  7.  Deep vein thrombosis prophylaxis on Lovenox.   CODE STATUS: No code, DO NOT RESUSCITATE.   Overall hospital stay remained stable.   The patient remained a no code, DO NOT RESUSCITATE.   TIME SPENT: 40 minutes.      ____________________________ Wylie HailSona A. Allena KatzPatel, MD sap:DT D: 10/06/2014 09:09:48 ET T: 10/06/2014 12:27:22 ET JOB#: [id number]  cc: Concepcion Kirkpatrick A. Allena KatzPatel, MD, <Dictator> Jorje GuildGlenn R. Beckey DowningWillett, MD Illene LabradorJames P. Angie FavaHooten Jr., MD  Willow OraSONA A Domanik Rainville MD ELECTRONICALLY SIGNED 10/23/2014 7:40

## 2015-04-04 NOTE — Op Note (Signed)
PATIENT NAME:  John Petersen, John Petersen MR#:  [id number] DATE OF BIRTH:  [birth year]  DATE OF PROCEDURE:  10/03/2014  PREOPERATIVE DIAGNOSIS: Displaced left femoral neck fracture.   POSTOPERATIVE DIAGNOSIS: Displaced left femoral neck fracture.  PROCEDURE PERFORMED: Left hip hemiarthroplasty.   SURGEON: Illene LabradorJames P. Angie FavaHooten Jr., MD.   ANESTHESIA: General.   ESTIMATED BLOOD LOSS: 700 mL.   FLUIDS REPLACED: 2000 mL of crystalloid.   DRAINS: Two medium drains, a Hemovac reservoir.  IMPLANTS UTILIZED: DePuy size 6 Summit femoral stem, 12-mm cementralizer, 52-mm outer diameter Cathcart hip ball, a +0-mm tapered spacer, and a size 4 cement restrictor.   INDICATIONS FOR SURGERY: The patient is an 81 year old male who fell, landed on his left hip and side. He was unable to stand or bear weight due to the pain. X-rays demonstrated a displaced left femoral neck fracture. After discussion of the risks and benefits of surgical intervention, the patient expressed understanding of the risks, benefits, and agreed with plans for surgical intervention.   PROCEDURE IN DETAIL: The patient was brought into the Operating Room and, after adequate general anesthesia was achieved, the patient was placed in the right lateral decubitus position. Axillary roll was placed, and all bony prominences were well padded. The patient's left hip and leg were cleaned and prepped with alcohol and DuraPrep, draped in the usual sterile fashion. A "timeout" was performed as per usual protocol. A lateral curvilinear incision was made gently curving towards the posterior superior iliac spine. IT band was incised in line with the skin incisions. Fibers of the gluteus maximus were split in line. The piriformis tendon was identified, skeletonized, and incised at its insertion on the proximal femur and reflected posteriorly. In a similar fashion, short external rotators were incised and reflected posteriorly. A T-type posterior capsulotomy was performed. A  large hemarthrosis was evacuated. The femoral head was removed using corkscrew device and measured using calipers. It was felt that a 52-mm diameter was appropriate. The acetabulum was inspected and noted to be free of fracture. Articular surface was in excellent condition. The femoral neck cut was performed using an oscillating saw. Pilot hole for reaming of the proximal femoral canal was prepared using a bur and subsequently a conical reamer. The proximal femur was then broached in a sequential fashion up to a size 6 Summit broach. The calcar region was planed accordingly, and trial reduction was performed with a 52-mm hip ball utilizing a +0 neck length. This allowed for good equalization of limb length and good stability both anteriorly and posteriorly.  Trials were removed. The femoral canal was sized, and it was felt that a size 4 cement restrictor was appropriate. A size 4 cement restrictor was inserted to the appropriate depth. Proximal femoral canal was irrigated with copious amounts of normal saline with antibiotic solution using pulsatile lavage then suctioned dry. The femoral canal was then packed with vaginal packing soaked in dilute Neo-Synephrine. Polymethylmethacrylate cement was prepared in the usual fashion using a vacuum mixer. Vaginal packing was removed and the canal again irrigated with copious amounts of normal saline with antibiotic solution and then suctioned dry. Cement was introduced in a retrograde fashion and then pressurized. A size 6 Summit femoral stem with a 12-mm distal cementralizer was positioned and impacted into place. Excess cement was removed using freer elevators.  After adequate curing of cement, the Morse taper was cleaned and dried. A 52-mm outer diameter Cathcart hip ball with a +0 tapered spacer was placed on the trunnion and impacted  into place. The acetabulum was irrigated and again inspected so as to be free of any debris. The hip was reduced and placed through a  range of motion. Good equalization of limb lengths was appreciated and good stability was noted both anteriorly and posteriorly. The wound was irrigated with copious amounts of normal saline with antibiotic solution using pulsatile lavage and then suctioned dry. Good hemostasis was subsequently achieved. The posterior capsulotomy was repaired using #5 Ethibond. Piriformis tendon was reapproximated to the undersurface of the gluteus medius tendon using #5 Ethibond. Two medium drains were placed in the wound bed and brought out through a separate stab incision to be attached to a Hemovac reservoir. IT band was repaired using interrupted sutures of #1 Vicryl. The subcutaneous tissue was approximated in layers using first #0 Vicryl followed by 2-0 Vicryl. Skin was closed with skin staples. A sterile dressing was applied.  The patient tolerated the procedure well. He was transported to the Recovery Room in stable condition.   ____________________________ Illene Labrador. Angie Fava., MD jph:jh D: 10/04/2014 23:59:47 ET T: 10/05/2014 12:22:24 ET JOB#: [id number]  cc: Illene Labrador. Angie Fava., MD, <Dictator> JAMES P Angie Fava MD ELECTRONICALLY SIGNED 10/10/2014 9:54

## 2015-04-04 NOTE — Op Note (Signed)
PATIENT NAME:  John Petersen, John Petersen MR#:  [id number] DATE OF BIRTH:  08-17-1929  DATE OF PROCEDURE:  04/07/2014  PREOPERATIVE DIAGNOSIS: Large bladder calculus.   POSTOPERATIVE DIAGNOSIS: Large bladder calculus.  PROCEDURE: Vesicolithotomy.    ANESTHESIA: General.   BLOOD LOSS: 0 mL.  DESCRIPTION OF PROCEDURE: After an appropriate timeout, agree upon by the surgeon, anesthetist and nurses, the procedure was begun. Foley catheter was inserted through the urethra into the bladder. Bladder is filled. The patient is in the supine position, so I am able to feel the bladder easily making an incision just above the pubic symphysis in the midline and carried through to the bladder. I opened the bladder. The edges of the bladder are grasped with Allis clamps. The thickened bladder wall is incised a little bit larger so I can reach with my finger and take out the calculus. The calculus will be given to the family. It is a large stellate calculus, very hard. Then, the bladder, muscle and mucosa are closed with a running 3-0 Vicryl suture. I do a double layer closure. The fascia is closed after I place a Jackson-Pratt drain laterally above the bladder and beneath the fascia. The fascia is closed with a running 2-0 Vicryl suture. Then the subcutaneous fascia is closed with 3-0 Vicryl suture running and the skin is closed with staples. This is a very small incision and it is not take long to close. Sterile dressings placed. The Jackson-Pratt is connected to suction and the Foley catheter has 10 mL in the balloon. It was left in the bladder. He is sent to recovery in satisfactory condition.   ____________________________ Caralyn Guileichard D. Edwyna ShellHart, DO rdh:aw D: 04/07/2014 10:50:52 ET T: 04/07/2014 11:03:39 ET JOB#: [id number]  cc: Caralyn Guileichard D. Edwyna ShellHart, DO, <Dictator> RICHARD D HART DO ELECTRONICALLY SIGNED 04/11/2014 8:18

## 2015-04-04 NOTE — Discharge Summary (Signed)
PATIENT NAME:  John Petersen, John Petersen MR#:  [id number] DATE OF BIRTH:  1929-05-25  DATE OF ADMISSION:  04/07/2014 DATE OF DISCHARGE:  04/08/2014  DISCHARGE DIAGNOSIS: Large stellate bladder calculus.   PROCEDURE: 04/07/14, a suprapubic vesical lithotomy and removal of calculus without difficulty and removal of calculus intact.  The patient's postop course was excellent. He had a lower than normal blood pressure and a slow heart rate in the 40s and 50s, but remained alert and oriented and this was assumed to be his normal blood pressure and heart rate. We withheld morphine pump as he tolerated the pain well utilizing 5 mg hydrocodone and 325 acetaminophen. He ate and drank. Urine was clear by the morning. Foley was left in and will be removed in 1 week to 10 days. His dressing was removed. His incision was clean and dry. His Jackson-Pratt left-sided drain was removed as there was minimal drainage. The patient was alert and oriented and actually conversed with me. He said he is happy to be leaving the hospital and was happy that his large stone was removed. He has no lung congestion. No chest pain. No evidence of pulmonary embolus. He was maintained on precautions for vein thrombosis. His abdomen is soft. Bowel sounds are present. He had a bowel movement this morning. He is discharged in satisfactory condition. Staples and catheter will be removed in 7 to 10 days. He will stay on his home medications of vitamins, Aggrenox and Bactrim. He needs a prescription written for pain. Otherwise, I am pleased with his progress and we will followup in the future. I think we can and keep him on tamsulosin, but I do not think he needs finasteride. His prostate did not feel that large on examination. I may actually have to place him on an anticholinergic if he has quite a bit of frequency.   ____________________________ Caralyn Guileichard D. Edwyna ShellHart, DO rdh:aw D: 04/08/2014 07:59:53 ET T: 04/08/2014 09:08:27 ET JOB#: [id number]  cc: Caralyn Guileichard  D. Edwyna ShellHart, DO, <Dictator> RICHARD D HART DO ELECTRONICALLY SIGNED 04/11/2014 8:18

## 2015-04-04 NOTE — Consult Note (Signed)
PATIENT NAME:  John Petersen, Tayven M MR#:  [id number] DATE OF BIRTH:  October 30, 1929  DATE OF CONSULTATION:  10/01/2014  REFERRING PHYSICIAN:  Ramonita LabAruna Gouru, MD (hospitalist).  CONSULTING PHYSICIAN:  James P. Angie FavaHooten Jr., MD  CHIEF COMPLAINT: Left hip pain.   HISTORY OF PRESENT ILLNESS: The patient is an 81 year old gentleman who was at home and had his legs give [place] causing him to fall and landed on his left hip and side. He denied any loss of consciousness. He had immediate onset of left hip pain, but denied any other injuries. He was unable to stand or bear weight due to the left hip pain. [facility] Room at which time x-rays demonstrated a displaced left femoral neck fracture.   PAST MEDICAL HISTORY: Benign prostatic hypertrophy, hypercholesterolemia, CVA, cerebral hemorrhage in 2001, status post herniorrhaphy.   MEDICATIONS AT HOME: Tamsulosin 0.4 mg daily, Bactrim 1 tablet b.i.d., multivitamin daily, lovastatin 20 mg daily, donepezil 10 mg daily, calcium plus vitamin D 1 tablet b.i.d., Aggrenox 25/200 mg extended release 1 tablet twice a day.   ALLERGIES: No known drug allergies.   SOCIAL HISTORY: The patient lives at home. No current tobacco or alcohol use.   FAMILY HISTORY: Noncontributory.   REVIEW OF SYSTEMS:  Pertinent musculoskeletal and neurologic review of systems is notable for left hip pain. He denies any gross numbness or weakness in the lower extremities.   PHYSICAL EXAMINATION:  GENERAL: The patient is a pleasant, well-developed, elderly gentleman seen in no acute distress.  HEENT: Atraumatic, normocephalic. Sclerae are clear. Extraocular motion is intact. The patient does have some hearing loss.  NECK: Supple, nontender, with good range of motion.  LUNGS: Clear to auscultation bilaterally.  CARDIAC: Regular rate and rhythm with normal S1, S2. No appreciable murmurs, gallops, or rubs. Pedal pulses are palpable. No gross pretibial or ankle edema.  ABDOMEN: Soft,  nontender, nondistended. Bowel sounds present.  MUSCULOSKELETAL: Good range of motion and stability in the upper extremities. Pertinent examination of lower extremity demonstrates shortening and external rotation of the left lower extremity. Pain is elicited with any attempt at range of motion actively or passively of the left hip. No knee effusion. No gross tenderness to palpation about the knee.  NEUROLOGIC: Awake, alert, and oriented. Sensory function is intact. Motor strength is felt to be 5/5 with the exception of the left hip musculature which was not assessed due to the injury. Good fine motor coordination is appreciated. No clonus or tremor.   X-RAYS: I reviewed radiographs of the left hip that were [facility] today. There is just a displaced left femoral neck fracture. Diffuse osteopenia is appreciated.   IMPRESSION: Displaced left femoral neck fracture.   PLAN: Findings were discussed in detail with the patient and his son. Recommendation was made for left hip hemiarthroplasty. I would like to hold the patient's Aggrenox for at least 24 hours and consider surgical intervention on Friday. The usual perioperative course was discussed in detail with the patient and his family. They expressed understanding of the risks and benefits, and agreed with plans for surgical intervention.    ____________________________ Illene LabradorJames P. Angie FavaHooten Jr., MD jph:lt D: 10/01/2014 22:38:49 ET T: 10/02/2014 07:17:08 ET JOB#: [id number]  cc: Illene LabradorJames P. Angie FavaHooten Jr., MD, <Dictator> Illene LabradorJAMES P Angie FavaHOOTEN JR MD ELECTRONICALLY SIGNED 10/02/2014 19:52

## 2015-04-04 NOTE — H&P (Signed)
PATIENT NAME:  John Petersen, John Petersen MR#:  [id number] DATE OF BIRTH:  1929/04/13  DATE OF ADMISSION:  10/01/2014  PRIMARY CARE PHYSICIAN:  Jorje Guild. Beckey Downing, MD   REFERRING EMERGENCY ROOM PHYSICIAN:  Darien Ramus, MD   CHIEF COMPLAINT:  Fall.   HISTORY OF PRESENT ILLNESS:  The patient is an 81 year old male who lives alone with 24-hour care. He has sustained a fall, and he was brought in to the ED. The patient has a history of dementia and has old history of stroke. He is supposed to ambulate with the help of a walker, but no one is quite sure whether the patient is ambulating with the help of a walker or not. In the ED, left hip x-ray has revealed a left femoral neck fracture. ER physician has called and notified Dr. Ernest Pine, the on-call orthopedics doctor. Pain medication was given and hospitalist team is called to admit the patient. During my examination, the patient is resting comfortably. Denies any other complaints. Son is at bedside. Denies any chest pain or shortness of breath.   PAST MEDICAL HISTORY:  Old history of stroke, hyperlipidemia, benign prostatic hypertrophy. Also, the patient had a history of cerebral hemorrhage in the year 2001, possibly from cerebrovascular accident. Following that, he never had any hemorrhage.   PAST SURGICAL HISTORY:  Bladder stone removal, neck surgery, hernia repair.   ALLERGIES:  No known drug allergies.   PSYCHOSOCIAL HISTORY:  Lives at home and has 24-hour care. No history of smoking, alcohol, or illicit drug usage.   FAMILY HISTORY:  Positive for cerebrovascular accident, coronary artery disease, and hypertension.   HOME MEDICATIONS:  Tamsulosin 0.4 mg 1 capsule p.o. once daily, multivitamin 1 tablet p.o. once daily, lovastatin 20 mg p.o. at bedtime, donepezil 10 mg p.o. once daily, calcium with vitamin D 2 tablets p.o. twice a day, Aggrenox 25/200 one capsule p.o. 2 times a day.   REVIEW OF SYSTEMS:  Unobtainable, as the patient is a poor historian  from dementia.   PHYSICAL EXAMINATION: VITAL SIGNS:  Temperature 97.7, pulse 60, respirations 18, blood pressure 125/43, pulse oximetry is 95% on room air.  GENERAL APPEARANCE:  Not in acute distress. Moderately built and obese.  HEENT:  Normocephalic, atraumatic. Pupils are equally reacting to light and accommodation. No scleral icterus. No conjunctival injection. No sinus tenderness. No postnasal drip. Moist mucous membranes.  NECK:  Supple. No JVD. No thyromegaly. Range of motion is intact.  CARDIAC:  S1, S2 normal. Regular rate and rhythm. No murmurs. Point of maximum impulse is not distal.  LUNGS:  Clear to auscultation bilaterally. No accessory muscle usage noted. Chest wall is nontender to palpation.   GASTROINTESTINAL:  Soft. Bowel sounds are positive in all 4 quadrants. Nontender, nondistended. No hepatosplenomegaly. No masses felt.  BACK:  No CVA tenderness.  MUSCULOSKELETAL:  No joint effusion. Positive left hip tenderness, small bruise is noticed, externally rotated. The rest of the joints are intact.  EXTREMITIES:  No edema, cyanosis, or clubbing.  SKIN:  Warm to touch. Normal turgor. No rashes. No lesions.  PSYCHIATRIC:  Mood and affect could not be elicited, as the patient has a chronic history of dementia.   LABORATORY DATA AND IMAGING STUDIES:  Chest x-ray, single view:  No acute cardiopulmonary abnormality. Hip x-ray:  Left femoral neck fracture.   A 12-lead EKG:  Sinus bradycardia at 57 beats per minute, left ventricular hypertrophy. No acute ST-T wave changes.   LFTs are normal. Initial set of cardiac enzymes  is normal. CBC: WBC is normal, hemoglobin 12.2, hematocrit 37.4, platelet count 180. The patient is A negative, antibody negative. PT and INR are normal. Urinalysis:  Yellow in color, turbid in appearance, nitrites negative, leukocyte esterase trace. BMP:  Glucose 116, BUN 20, the rest of the BMP is normal. Anion gap is at 6.   ASSESSMENT AND PLAN:  An 81 year old  Caucasian male with past medical history of stroke, currently on Aggrenox, who had sustained a fall and was brought in to the ED and diagnosed with a left femoral neck fracture. I will hold Aggrenox and medically clear him for surgery on Friday.   1.  Left femoral neck fracture, initial encounter. We will admit him to med/surg floor. Consult is placed to on-call orthopedics, Dr. Ernest PineHooten. Pain management by Dr. Ernest PineHooten   2.  Hyperlipidemia. Resume his home medication statin.  3.  Dementia. Continue home medication donepezil.  4.  Benign prostatic hypertrophy. Continue tamsulosin.  5.  History of cerebrovascular accident. We will hold Aggrenox in view of surgery.  6.  Urinalysis is positive, possible acute cystitis. We will get a urine culture, and the patient will be on IV Rocephin in the interim.  7.  We will provide gastrointestinal prophylaxis and deep vein thrombosis prophylaxis with sequential compression devices.   CODE STATUS:  He is DO NOT RESUSCITATE.   Son is the medical power of attorney. Plan of care was discussed in detail with the patient's son at bedside. He verbalized understanding of the plan.   TOTAL TIME SPENT:  50 minutes.    ____________________________ Ramonita LabAruna Waqas Bruhl, MD ag:nb D: 10/01/2014 21:03:24 ET T: 10/01/2014 22:40:37 ET JOB#: [id number]  cc: Ramonita LabAruna Lala Been, MD, <Dictator> Ramonita LabARUNA Ambrea Hegler MD ELECTRONICALLY SIGNED 10/29/2014 14:04

## 2015-04-06 LAB — SURGICAL PATHOLOGY

## 2015-04-12 NOTE — Consult Note (Signed)
Chief Complaint:  Subjective/Chief Complaint no issues overnight.  catheter remains in place.  afebrile.  Cr improved to baseline with Foley.   VITAL SIGNS/ANCILLARY NOTES: **Vital Signs.:   28-Jan-16 13:21  Vital Signs Type Routine  Temperature Temperature (F) 98.6  Celsius 37  Temperature Source oral  Pulse Pulse 58  Respirations Respirations 20  Systolic BP Systolic BP 993  Diastolic BP (mmHg) Diastolic BP (mmHg) 57  Mean BP 82  Pulse Ox % Pulse Ox % 97  Pulse Ox Activity Level  At rest  Oxygen Delivery Room Air/ 21 %  *Intake and Output.:   Daily 28-Jan-16 07:00  Grand Totals Intake:  240 Output:  2250    Net:  -2010 24 Hr.:  -2010  Oral Intake      In:  240  Urine ml     Out:  2250  Length of Stay Totals Intake:  480 Output:  2250    Net:  -7169   Brief Assessment:  GEN well developed, well nourished, no acute distress   Cardiac Regular   Respiratory normal resp effort  no use of accessory muscles   Gastrointestinal Normal   Gastrointestinal details normal Soft  bladder nonpalpable   EXTR negative cyanosis/clubbing   Additional Physical Exam Foley in place draining clear yellow urine   Lab Results:  Routine Micro:  26-Jan-16 12:45   Organism Name Escherichia coli  Organism Quantity 20,000 CFU/ML  Nitrofurantoin Sensitivity S  Cefazolin Sensitivity S  Ampicillin Sensitivity S  Ceftriaxone Sensitivity S  Ciprofloxacin Sensitivity S  Gentamicin Sensitivity S  Imipenem Sensitivity S  Levofloxacin Sensitivity S  Trimethoprim/Sulfamethoxazole Sensitivty S  Cefoxitin Sensitivity S  Micro Text Report URINE CULTURE   ORGANISM 1                20,000 CFU/ML Escherichia coli   COMMENT                   WITH MIXED-BACTERIAL-ORGANISMS   ANTIBIOTIC                    ORG#1     AMPICILLIN                    S         CEFAZOLIN                     S         CEFOXITIN                     S         CEFTRIAXONE                   S         CIPROFLOXACIN                  S         GENTAMICIN                    S         IMIPENEM                      S         LEVOFLOXACIN                  S    NITROFURANTOIN  S         Trimethoprim/Sulfamethoxazole S  Specimen Source IN/OUT CATH  Organism 1 20,000 CFU/ML Escherichia coli  Culture Comment WITH MIXED-BACTERIAL-ORGANISMS  Result(s) reported on 08 Jan 2015 at 12:44PM.  Routine Chem:  28-Jan-16 09:31   Glucose, Serum  110  BUN 16  Creatinine (comp) 0.85  Sodium, Serum 144  Potassium, Serum  3.4  Chloride, Serum  112  CO2, Serum 26  Calcium (Total), Serum  8.1  Anion Gap  6  Osmolality (calc) 289  eGFR (African American) >60  eGFR (Non-African American) >60 (eGFR values <27mL/min/1.73 m2 may be an indication of chronic kidney disease (CKD). Calculated eGFR, using the MRDR Study equation, is useful in  patients with stable renal function. The eGFR calculation will not be reliable in acutely ill patients when serum creatinine is changing rapidly. It is not useful in patients on dialysis. The eGFR calculation may not be applicable to patients at the low and high extremes of body sizes, pregnant women, and vegetarians.)  Routine UA:  26-Jan-16 12:45   Color (UA) Yellow  Clarity (UA) Cloudy  Glucose (UA) Negative  Bilirubin (UA) Negative  Ketones (UA) Negative  Specific Gravity (UA) 1.016  Blood (UA) 3+  pH (UA) 5.0  Protein (UA) 30 mg/dL  Nitrite (UA) Negative  Leukocyte Esterase (UA) 2+ (Result(s) reported on 06 Jan 2015 at 01:10PM.)  RBC (UA) 222 /HPF  WBC (UA) 64 /HPF  Bacteria (UA) NONE SEEN  Epithelial Cells (UA) 2 /HPF  WBC Clump (UA) PRESENT  Mucous (UA) PRESENT  Hyaline Cast (UA) 3 /LPF  Calcium Oxalate Crystal (UA) PRESENT (Result(s) reported on 06 Jan 2015 at 01:10PM.)  Routine Hem:  28-Jan-16 09:31   WBC (CBC) 7.1  RBC (CBC)  3.52  Hemoglobin (CBC)  9.7  Hematocrit (CBC)  30.3  Platelet Count (CBC) 155  MCV 86  MCH 27.4  MCHC  31.9  RDW  16.2   Neutrophil % 82.9  Lymphocyte % 6.0  Monocyte % 6.9  Eosinophil % 3.5  Basophil % 0.7  Neutrophil # 5.9  Lymphocyte #  0.4  Monocyte # 0.5  Eosinophil # 0.3  Basophil # 0.0 (Result(s) reported on 08 Jan 2015 at 09:54AM.)   Assessment/Plan:  Invasive Device Daily Assessment of Necessity:  Does the patient currently have any of the following indwelling devices? foley   Assessment/Plan:  Assessment 81 yo M with acute renal failure 2/2 urinary retention, Cr now 0.85 from 2.17 with placement of Foley catheter.  UCx growing 20K E. coli , pansensitive.  1. Treat UTI, agree wtih transistion to PO keflex 2. Maintain Foley 5-7 days 3. Continue flomax/ finasteride  4. Follow up with [facility] for outpatient voiding trial if discharge prior to cath  within a few weeks of discharge if catheter removed during this admission 5. No need for RUS given resolution on ARF  Urology to sign off, please page with any question or concerns (Dr. Erlene Quan)   Electronic Signatures: Sherlynn Stalls (MD)  (Signed 28-Jan-16 13:57)  Authored: Chief Complaint, VITAL SIGNS/ANCILLARY NOTES, Brief Assessment, Lab Results, Assessment/Plan   Last Updated: 28-Jan-16 13:57 by Sherlynn Stalls (MD)

## 2015-04-12 NOTE — Consult Note (Signed)
PATIENT NAME:  John Petersen, EDMISTON MR#:  [id number] DATE OF BIRTH:  1929/04/22  DATE OF CONSULTATION:  01/07/2015  REFERRING PHYSICIAN: Vivek J. Cherlynn Kaiser, MD  CONSULTING PHYSICIAN:  Claris Gladden, MD  CHIEF COMPLAINT: Urinary retention and acute renal failure.   HISTORY OF PRESENT ILLNESS: This is an 81 year old male who was admitted from assisted living after sustaining a fall to rule out head injury. He also has had some difficulty voiding overnight and was found to have a creatinine of 1.85 on admission, rising to 2.71 this morning. A Foley catheter was placed by the nursing staff earlier today at which time 750 mL of amber-colored urine was drained. He also was straight catheterized for a urine sample, which was moderately suspicious for a urinary tract infection with red blood cells and white blood cells present. Preliminary urine culture is growing 20,000 colonies of gram-negative rods with speciation pending.   The patient is unable to provide additional history given his dementia, but does only have complaints today of loose stool. He does not complain of any urinary symptoms. He does have a history of bladder stones and underwent an open cystolithotomy by Dr. Edwyna Shell back in April 2015. Per our office records, he is supposed to be on finasteride and Flomax for his history of BPH.  REVIEW OF SYSTEMS: Unable to obtain given the patient's mental status/history of dementia.   PAST MEDICAL HISTORY: 1.  Dementia. 2.  BPH with bladder outlet obstruction, bladder stones.  3.  History of previous CVA.  4.  Depression.  5.  Hyperlipidemia   ALLERGIES: MORPHINE.   SOCIAL HISTORY: Per admission H and P, no history of smoking, alcohol or illicit drug use. He currently lives at [facility].   FAMILY HISTORY: Mother died from a CVA. Father died in a car accident.   MEDICATIONS:  1.  Aggrenox 1 tablet p.o. b.i.d.  2.  Celexa 20 mg at bedtime. 3.  Aricept 10 mg at bedtime.   4.  Lovenox 20 mg daily.  5.  Milk of magnesium 30 mL b.i.d. p.r.n.  6.  Oxycodone 5 mg q. 4 hours p.r.n.  7.  Promethazine 25 mg q. 4 hours p.r.n.  8.  Promethazine rectal suppository p.r.n.  9.  Senokot 1 tablet b.i.d.  10.  Flomax 0.4 mg daily.  11.  Tylenol 325 mg 1 to 2 tablet q. 4 hours.  12.  Ultram 1 tablet q. 4 hours p.r.n. pain.   PHYSICAL EXAMINATION:  VITAL SIGNS: Temperature maximum 99.5, temperature current 98.4, pulse 57, BP 125/54, on room air 95%.  INTAKE AND OUTPUT: Output 750 urine since placement of Foley catheter. GENERAL: No acute distress. He is not currently oriented and is hard of hearing and unable to answer questions appropriately  HEENT: Atraumatic, normocephalic. Moist mucous membranes.   NECK: Supple. No masses. Trachea is midline.  HEART: No clubbing, cyanosis, or edema bilaterally.  LUNGS: No increased work of breathing. No use of accessory muscles or retractions.  ABDOMEN: Soft, nontender, nondistended. Bladder is nonpalpable.  NEUROLOGIC: Oriented only x1, moving all extremities. No obvious focal deficits.  SKIN: No rashes or suspicious lesions. No bruising.  LYMPHATIC: No significant for cervical or inguinal lymphadenopathy.  GENITOURINARY: No CVA tenderness bilaterally. He does have a lower midline abdominal scar consistent with open cystotomy. Scrotum is nontender, nonswollen. Bilateral testicles without masses. Circumcised phallus with Foley catheter in place, Foley draining amber-colored urine.   LABORATORY DATA: Creatinine rising from 1.85 on admission  to 2.17. White blood cells 12.1 on admission, down to 10.8 today. UA obtained upon admission shows specific gravity of 1.016, pH 5.0, positive hyaline casts, 30 mg/dL protein, [id number] red blood cells per high-powered field, 64 white blood cells per high-powered field, 2 epithelial cells per high-powered field, nitrite is negative. Urine culture is growing 20,000 colonies of gram-negative rods.  IMAGING  DATA: No recent imaging data for review. Most recent CT abdomen and pelvis was performed in March 2015, which shows bladder stones, but no evidence of upper tract, kidney or ureteral nephrolithiasis.   ASSESSMENT AND PLAN: This is an [age] year old with a history of dementia, benign prostatic hypertrophy, and history of bladder stones, status post removal who has a rising creatinine up to 2.17, found to be in acute urinary retention with 750 mL of urine output upon placement of Foley catheter. I suspect this is the underlying etiology of his worsening renal function. This is likely multifactorial given his mental status, urinary tract infection and history of benign prostatic hypertrophy. He is currently being treated on IV antibiotics for his underlying infection.   RECOMMENDATIONS:  1.  Continue to treat urinary tract infection and adjust antibiotics based on culture and sensitivity data.  2.  Maintain Foley catheter for 5-7 days to allow for adequate decompression of his urinary tract, especially given his underlying renal dysfunction which most likely is related to obstruction.  3.  Monitor creatinine, expect improvement by tomorrow with a Foley catheter in place. If his renal function is not improving, I would recommend obtaining a renal ultrasound tomorrow to rule out any obstructing stones or residual hydronephrosis.  4.  The patient should [facility] as an [facility].   Thank you for allowing me to participate in the care of this patient. Please call Dr. Apolinar JunesBrandon with any questions or concerns.   ____________________________ Claris GladdenAshley J. Katalea Ucci, MD ajb:TT D: 01/07/2015 15:02:50 ET T: 01/07/2015 16:28:46 ET JOB#: [id number]  cc: Claris GladdenAshley J. Adalyn Pennock, MD, <Dictator> Claris GladdenASHLEY J Joette Schmoker MD ELECTRONICALLY SIGNED 01/14/2015 14:31

## 2015-04-12 NOTE — H&P (Signed)
PATIENT NAME:  John Petersen, John Petersen MR#:  [id number] DATE OF BIRTH:  16-Apr-1929  DATE OF ADMISSION:  01/06/2015  PRIMARY CARE PHYSICIAN: Karie Schwalbe, MD   CHIEF COMPLAINT: Recurrent falls.   HISTORY OF PRESENT ILLNESS: This is an 81 year old male who presents from [facility] due to having 2 falls today. He had a fall earlier at 5:00 this morning and he was found by the nursing staff on the floor. They helped him up and then he had another fall at 10:00 this morning. They were worried about some head injury, so they sent him to the ER for further evaluation. The patient underwent a CT head and cervical spine, which was essentially normal, although the patient was noted to be in acute renal failure and noted to have an acute urinary tract infection. Therefore, hospitalist services were contacted for further treatment and evaluation.   The patient has a profound dementia; therefore, most of the history is obtained from the son at bedside.   REVIEW OF SYSTEMS: Otherwise unobtainable, given the patient's advanced dementia.   PAST MEDICAL HISTORY: Consistent with dementia, history of a recent staghorn calculus, benign prostatic hypertrophy, history of previous cerebrovascular accident, depression, hyperlipidemia.   ALLERGIES: MORPHINE.   SOCIAL HISTORY: No smoking. No alcohol abuse. No illicit drug abuse. Resides at [facility].   FAMILY HISTORY: Mother died from a CVA. Father died from a car accident.   CURRENT MEDICATIONS: Aggrenox 1 tablet b.i.d., Celexa 20 mg at bedtime, Aricept 10 mg at bedtime, lovastatin 20 mg daily, milk of magnesia 30 mL b.i.d. as needed, oxycodone 5 mg q. 4 hours as needed. Promethazine 25 mg q. 4 hours as needed, promethazine rectal suppository as needed, Senokot 1 tablet b.i.d., Flomax 0.4 mg daily, Tylenol 5 mg 1-2 tablets q. 4 hours as needed, Ultram 1 tablet q. 4 hours as needed for pain.   PHYSICAL EXAMINATION: Presently is as follows:   VITAL SIGNS: Temperature 98.1, pulse 65, respirations 18, blood pressure 112/89. Saturations are 97% on room air.  GENERAL: He is a pleasant-appearing male, but in no apparent distress.  HEAD, EYES, EARS, NOSE, THROAT: Atraumatic, normocephalic. Extraocular muscles were intact. Pupils were reactive to light. Sclerae were anicteric. No conjunctival injection. No pharyngeal erythema.  NECK: Supple. There was no jugular venous distention. No bruits, no lymphadenopathy, no thyromegaly.  HEART: Regular rate and rhythm. He does have a 2-3 systolic ejection murmur heard at the base. No rubs. No clicks.  LUNGS: Clear to auscultation bilaterally. No rales, no rhonchi, no wheezes.  ABDOMEN: Soft, flat, nontender, nondistended. Has good bowel sounds. No hepatosplenomegaly appreciated.  EXTREMITIES: No evidence of any cyanosis, clubbing, or peripheral edema. Has +2 pedal and radial pulses bilaterally.  NEUROLOGICAL: The patient is alert, awake, and oriented x1; moves all extremities spontaneously. No other focal motor or sensory deficits appreciated bilaterally.  SKIN: Moist and warm, with no rashes appreciated.  LYMPHATIC: There is no cervical or axillary lymphadenopathy.   LABORATORY DATA:  Serum glucose of 102. BUN 30, creatinine 1.8, sodium 139, potassium 4.3, chloride 106, bicarbonate 26. Troponin less than 0.02. White cell count 12.1, hemoglobin 12.0, hematocrit 37.7, platelet count is still pending.  The patient's urinalysis shows 2+ leukocyte esterase with 64 white cells.  The patient did have a CT of the head and cervical spine done, which showed no acute intracranial abnormality, no demonstrated fracture or spondylolisthesis. No acute intracranial hemorrhage or mass.   ASSESSMENT AND PLAN: This is an  81 year old male with a history of dementia, history of recent staghorn calculus, benign prostatic hypertrophy, history of previous cerebrovascular accident, depression, and hyperlipidemia who presents  to the hospital due to recurrent falls and noted to be in acute renal failure with urinary tract infection.  1.  Acute renal failure. This is likely secondary to dehydration, also to urinary tract infection. I will hydrate the patient with IV fluids, and also give IV antibiotics for the urinary tract infection. Follow BUN and creatinine and urine output. Renal dose medications. Avoid nephrotoxins.  2.  Urinary tract infection. We will go ahead and treat the patient with IV ceftriaxone, follow urine cultures.  3.  History of Clostridium difficile; this has now resolved. The patient has no acute diarrhea.  4.  Benign prostatic hypertrophy. Continue Flomax.  5.  History of previous cerebrovascular accident. Continue Aggrenox.  6.  Depression. Continue Celexa.  7.  Hyperlipidemia. Continue atorvastatin.  8.  Dementia. Continue Aricept.  9.  Recurrent falls. We will get a physical therapy evaluation to assess the patient's mobility.   CODE STATUS: The patient is a DNI/DNR.   TIME SPENT ON ADMISSION: 50 minutes.    ____________________________ Rolly PancakeVivek J. Cherlynn KaiserSainani, MD vjs:MT D: 01/06/2015 15:23:46 ET T: 01/06/2015 15:59:49 ET JOB#: [id number]  cc: Rolly PancakeVivek J. Cherlynn KaiserSainani, MD, <Dictator> Houston SirenVIVEK J Helina Hullum MD ELECTRONICALLY SIGNED 01/24/2015 12:47

## 2015-04-12 NOTE — Discharge Summary (Signed)
PATIENT NAME:  John Petersen, John Petersen MR#:  [id number] DATE OF BIRTH:  01-06-1929  DATE OF ADMISSION:  01/06/2015 DATE OF DISCHARGE:  01/08/2015  ADMITTING DIAGNOSIS: Acute renal failure.   DISCHARGE DIAGNOSES:  1. Acute renal failure, likely postobstructive uropathy.  2. Pyuria, questionable urinary tract infection. Recommended to treat by urology.  3. Bladder outlet obstruction, now with Foley catheter.  4. Dysphagia.  5. Leukocytosis. 6. Hyperglycemia with hemoglobin A1c 5.9. No diabetes mellitus.  7. Generalized weakness. 8. History of dementia, benign prostatic hypertrophy, stroke, depression, hyperlipidemia.   DISCHARGE CONDITION: Stable.   DISCHARGE MEDICATIONS: The patient is to continue tamsulosin 0.4 mg p.o. daily, donepezil 10 mg p.o. at bedtime, Aggrenox 1 capsule twice daily, lovastatin 10 mg p.o. daily, Celexa 10 mg p.o. at bedtime, milk of magnesia 30 mL twice daily as needed, Senokot 8.6 mg twice daily, Tylenol 500 mg 1-2 tablets every 4 hours as needed, promethazine 25 mg p.o. every 4 hours as needed, rectal suppository, also promethazine, 1 suppository every 4 hours as needed or injectable solution every 4 hours intramuscularly as needed, oxycodone 5 mg every 4 hours as needed, Ultram 50 mg p.o. every 4 hours as needed, finasteride 5 mg p.o. daily, Keflex 250 mg p.o. every 8 hours for 5 more days, calcium with vitamin D 500/200 one tablet twice daily with meals, and multivitamins once daily.   HOME OXYGEN: None.   DIET: 2 grams salt, low-fat, low-cholesterol diet with dietary supplements: Ensure 3 times a day.   DIET CONSISTENCY: Mechanical soft.   ACTIVITY LIMITATIONS: As tolerated.   REFERRALS: To physical therapy 2 to 7 times a week.   FOLLOWUP APPOINTMENT: With Dr. Alphonsus SiasLetvak in 2 days after discharge.   The patient's Foley catheter should be withdrawn in the next 3 or 5 days after discharge.   CONSULTANTS: Care management, social worker, Dr. Apolinar JunesBrandon, urology.    RADIOLOGIC STUDIES: CT scan of cervical spine without contrast on January 06, 2015 showed multifocal osteoarthritic changes, no demonstrable fracture or spondylolisthesis. CT of head showed atrophy with extensive small vessel disease and prior infarct. No intracranial hemorrhage or mass. No extra-axial fluid. Chest x-ray PA and lateral, on January 07, 2015 revealed chronic bronchitic markings without acute findings. Barium swallowing study January 08, 2015 revealed tiny hiatal hernia, otherwise negative examination. No obstructing abnormality was noted. Ultrasound of kidneys January 08, 2015 revealed normal-sized kidneys without hydronephrosis or nephrolithiasis. Focal cortical scarring over the right midpole unchanged.   HISTORY OF PRESENT ILLNESS: The patient is an 81 year old Caucasian male with history of dementia, history of stroke, who presented to the hospital on January 06, 2015 with recurrent falls. Please refer to Dr. Hilbert OdorSainani's admission note on January 06, 2015.   PHYSICAL EXAMINATION: On arrival to the hospital, the patient's vital signs: Temperature was 98.1, pulse was 65, respiration rate was 18, blood pressure 112/89, saturation was 97% on room air. Physical exam was unremarkable except for 2 to 3/6 systolic murmur at the base of heart.   LABORATORY: The patient's lab data done on arrival to the hospital revealed elevated BUN and creatinine to 30 and 1.85 respectively. Glucose was 105, 102, otherwise BMP was unremarkable. The patient's CK total was normal at 165, and troponin was less than 0.02. White blood cell count was elevated to 12.1, hemoglobin was 12.0, platelet count was not checked since the patient's platelets were clumping in the specimen and could not be reported. The patient's absolute neutrophil count was elevated at 10.3. Urinalysis revealed yellow,  cloudy urine, negative for glucose, bilirubin or ketones. Specific gravity was 1.016, pH was 5.0, 3+ blood, 30 mg/dL protein,  negative for nitrites, 2+ leukocyte esterase, 222 red blood cells, 64 white blood cells. No bacteria was seen. The patient's urine cultures, however, came back 20,000 colony-forming units of E. coli, sensitive to all antibiotics.   The patient was admitted to the hospital for further evaluation. He was rehydrated and with rehydration, however, he was noted to have fever and worsening kidney failure. The patient's BUN and creatinine rechecked on the January 07, 2015 was found to be 32 and 2.17, respectively. At that point, the patient did have a bladder scan done, which revealed more than 1 liter of uterine. It was felt that the patient had urinary obstruction and Foley catheter was placed with relief of his obstruction. The patient was added finasteride and urology consultation was requested. Dr. Apolinar Junes saw the patient in consultation. She recommended to get ultrasound of his kidneys, which was unremarkable and did not show any hydronephrosis. She felt that the patient's urinary tract infection should be treated. She recommended to continue Foley catheter for the next 5 to 7 days for adequate decompression of his urinary tract and withdraw Foley catheter as outpatient. She also recommended for the patient to follow up with [facility]. The patient was continued on Rocephin while he was in the hospital, however, Rocephin was changed to p.o. Keflex upon discharge. His kidney function with rehydration, as well as Foley catheter for decompression, improved to normal. On the day of discharge, January 08, 2015, the patient's BUN and creatinine were 16 and 0.85. The patient is to continue medications to shrink his prostate, including tamsulosin, as well as finasteride.   The patient was complaining of some dysphagia symptoms, including feeling that the food gets stuck in his esophagus with swallowing. He underwent barium swallowing study, which was unremarkable, and he also underwent speech  therapist evaluation, who recommended mechanical soft diet with thin liquids. Chest x-ray was performed and no aspiration was noted.   For generalized weakness, the patient is to continue physical therapy in the skilled nursing facility. For his chronic medical problems such as dementia, stroke, depression, hyperlipidemia, no changes were made. The patient is to continue his outpatient management. The patient is being discharged in stable condition with the above-mentioned medications and follow-up. On the day of discharge, temperature was 98.6, pulse was 58, respiration rate was 20, blood pressure 133/57, saturation was 97% on room air at rest.   TIME SPENT: 40 minutes.  ____________________________ Katharina Caper, MD rv:ap D: 01/08/2015 15:04:21 ET T: 01/08/2015 15:33:29 ET JOB#: [id number]  cc: Katharina Caper, MD, <Dictator> Karie Schwalbe, MD  Donn Zanetti Winona Legato MD ELECTRONICALLY SIGNED 01/22/2015 15:40

## 2015-05-15 DIAGNOSIS — F0151 Vascular dementia with behavioral disturbance: Secondary | ICD-10-CM | POA: Diagnosis not present

## 2015-05-15 DIAGNOSIS — R451 Restlessness and agitation: Secondary | ICD-10-CM | POA: Diagnosis not present

## 2015-05-15 DIAGNOSIS — F39 Unspecified mood [affective] disorder: Secondary | ICD-10-CM | POA: Diagnosis not present

## 2015-05-15 DIAGNOSIS — N400 Enlarged prostate without lower urinary tract symptoms: Secondary | ICD-10-CM | POA: Diagnosis not present

## 2015-05-18 DIAGNOSIS — S63614A Unspecified sprain of right ring finger, initial encounter: Secondary | ICD-10-CM | POA: Diagnosis not present

## 2015-07-22 DIAGNOSIS — N400 Enlarged prostate without lower urinary tract symptoms: Secondary | ICD-10-CM | POA: Diagnosis not present

## 2015-07-22 DIAGNOSIS — F0150 Vascular dementia without behavioral disturbance: Secondary | ICD-10-CM | POA: Diagnosis not present

## 2015-07-22 DIAGNOSIS — R451 Restlessness and agitation: Secondary | ICD-10-CM | POA: Diagnosis not present

## 2015-07-22 DIAGNOSIS — F39 Unspecified mood [affective] disorder: Secondary | ICD-10-CM

## 2015-07-22 DIAGNOSIS — E785 Hyperlipidemia, unspecified: Secondary | ICD-10-CM | POA: Diagnosis not present

## 2015-10-14 ENCOUNTER — Encounter: Payer: Self-pay | Admitting: *Deleted

## 2015-10-15 ENCOUNTER — Encounter: Payer: Self-pay | Admitting: Obstetrics and Gynecology

## 2015-10-15 ENCOUNTER — Ambulatory Visit: Payer: Self-pay | Admitting: Obstetrics and Gynecology

## 2018-10-12 DEATH — deceased
